# Patient Record
Sex: Male | Born: 1960 | Race: White | Hispanic: No | Marital: Single | Smoking: Never smoker
Health system: Southern US, Community
[De-identification: ages and names within clinical notes are randomized; demographics above are authoritative.]

## PROBLEM LIST (undated history)

## (undated) HISTORY — PX: WISDOM TOOTH EXTRACTION: SHX21

## (undated) HISTORY — PX: TONSILLECTOMY: SUR1361

---

## 2017-11-05 ENCOUNTER — Other Ambulatory Visit: Payer: Self-pay

## 2017-11-05 ENCOUNTER — Encounter (HOSPITAL_COMMUNITY): Payer: Self-pay

## 2017-11-05 ENCOUNTER — Emergency Department (HOSPITAL_COMMUNITY): Payer: BC Managed Care – PPO

## 2017-11-05 ENCOUNTER — Emergency Department (HOSPITAL_COMMUNITY)
Admission: EM | Admit: 2017-11-05 | Discharge: 2017-11-05 | Disposition: A | Discharge status: Home / Self Care | Payer: BC Managed Care – PPO | Attending: Emergency Medicine | Admitting: Emergency Medicine

## 2017-11-05 DIAGNOSIS — Z79899 Other long term (current) drug therapy: Secondary | ICD-10-CM | POA: Insufficient documentation

## 2017-11-05 DIAGNOSIS — R55 Syncope and collapse: Secondary | ICD-10-CM | POA: Diagnosis not present

## 2017-11-05 LAB — CBC WITH DIFFERENTIAL/PLATELET
BASOS PCT: 0 %
Basophils Absolute: 0 10*3/uL (ref 0.0–0.1)
EOS ABS: 0.1 10*3/uL (ref 0.0–0.7)
Eosinophils Relative: 1 %
HEMATOCRIT: 47.4 % (ref 39.0–52.0)
HEMOGLOBIN: 16.7 g/dL (ref 13.0–17.0)
LYMPHS ABS: 1.3 10*3/uL (ref 0.7–4.0)
Lymphocytes Relative: 13 %
MCH: 33.4 pg (ref 26.0–34.0)
MCHC: 35.2 g/dL (ref 30.0–36.0)
MCV: 94.8 fL (ref 78.0–100.0)
Monocytes Absolute: 0.8 10*3/uL (ref 0.1–1.0)
Monocytes Relative: 8 %
NEUTROS ABS: 7.6 10*3/uL (ref 1.7–7.7)
NEUTROS PCT: 78 %
Platelets: 204 10*3/uL (ref 150–400)
RBC: 5 MIL/uL (ref 4.22–5.81)
RDW: 12.6 % (ref 11.5–15.5)
WBC: 9.7 10*3/uL (ref 4.0–10.5)

## 2017-11-05 LAB — URINALYSIS, ROUTINE W REFLEX MICROSCOPIC
Bilirubin Urine: NEGATIVE
GLUCOSE, UA: NEGATIVE mg/dL
Hgb urine dipstick: NEGATIVE
Ketones, ur: 5 mg/dL — AB
LEUKOCYTES UA: NEGATIVE
Nitrite: NEGATIVE
PH: 6 (ref 5.0–8.0)
PROTEIN: NEGATIVE mg/dL
SPECIFIC GRAVITY, URINE: 1.016 (ref 1.005–1.030)

## 2017-11-05 LAB — COMPREHENSIVE METABOLIC PANEL
ALBUMIN: 4 g/dL (ref 3.5–5.0)
ALK PHOS: 71 U/L (ref 38–126)
ALT: 31 U/L (ref 17–63)
AST: 26 U/L (ref 15–41)
Anion gap: 10 (ref 5–15)
BILIRUBIN TOTAL: 0.8 mg/dL (ref 0.3–1.2)
BUN: 19 mg/dL (ref 6–20)
CALCIUM: 8.6 mg/dL — AB (ref 8.9–10.3)
CO2: 20 mmol/L — AB (ref 22–32)
CREATININE: 1.04 mg/dL (ref 0.61–1.24)
Chloride: 110 mmol/L (ref 101–111)
GFR calc Af Amer: >60 mL/min (ref >60)
GFR calc non Af Amer: >60 mL/min (ref >60)
GLUCOSE: 98 mg/dL (ref 65–99)
Potassium: 3.6 mmol/L (ref 3.5–5.1)
SODIUM: 140 mmol/L (ref 135–145)
Total Protein: 7.1 g/dL (ref 6.5–8.1)

## 2017-11-05 LAB — I-STAT TROPONIN, ED: Troponin i, poc: 0 ng/mL (ref 0.00–0.08)

## 2017-11-05 LAB — D-DIMER, QUANTITATIVE: D-Dimer, Quant: 0.38 ug/mL-FEU (ref 0.00–0.50)

## 2017-11-05 MED ORDER — SODIUM CHLORIDE 0.9 % IV BOLUS
1000.0000 mL | Freq: Once | INTRAVENOUS | Status: AC
  Administered 2017-11-05: 1000 mL via INTRAVENOUS

## 2017-11-05 NOTE — ED Notes (Signed)
Patient transported to X-ray 

## 2017-11-05 NOTE — ED Triage Notes (Addendum)
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 He walked to the nurse and layed down. Never experienced full syncope. The nurse said he was pale and diaphoretic. No hx. Per ems. This has never happened to him before.  Zofran given per ems. Patient c/o Light headedness that comes and goes. 200 cc of normal saline. Negative for orthostatics per ems.

## 2017-11-05 NOTE — ED Provider Notes (Signed)
[facility]-EMERGENCY DEPT Provider Note   CSN: [id number] Arrival date & time: 11/05/17  1205     History   Chief Complaint Chief Complaint  Patient presents with  . Near Syncope    HPI Kadin Canipe is a 57 y.o. male.  Patient states that he felt dizzy and on the at school today.  Patient feels better now  The history is provided by the patient. No language interpreter was used.  Near Syncope  This is a new problem. The current episode started less than 1 hour ago. The problem occurs rarely. The problem has been resolved. Pertinent negatives include no chest pain, no abdominal pain and no headaches. Nothing aggravates the symptoms. Nothing relieves the symptoms.    History reviewed. No pertinent past medical history.  There are no active problems to display for this patient.   History reviewed. No pertinent surgical history.      Home Medications    Prior to Admission medications   Medication Sig Start Date End Date Taking? Authorizing Provider  allopurinol (ZYLOPRIM) 300 MG tablet Take 300 mg by mouth daily.   Yes [provider]  Ascorbic Acid (VITAMIN C PO) Take 1 tablet by mouth daily.   Yes [provider]  Multiple Vitamin (MULTIVITAMIN WITH MINERALS) TABS tablet Take 2 tablets by mouth daily.   Yes [provider]    Family History History reviewed. No pertinent family history.  Social History Social History   Tobacco Use  . Smoking status: Not on file  Substance Use Topics  . Alcohol use: Not on file  . Drug use: Not on file     Allergies   Patient has no allergy information on record.   Review of Systems Review of Systems  Constitutional: Negative for appetite change and fatigue.  HENT: Negative for congestion, ear discharge and sinus pressure.   Eyes: Negative for discharge.  Respiratory: Negative for cough.   Cardiovascular: Positive for near-syncope. Negative for chest pain.    Gastrointestinal: Positive for nausea. Negative for abdominal pain and diarrhea.  Genitourinary: Negative for frequency and hematuria.  Musculoskeletal: Negative for back pain.  Skin: Negative for rash.  Neurological: Positive for dizziness. Negative for seizures and headaches.  Psychiatric/Behavioral: Negative for hallucinations.     Physical Exam Updated Vital Signs BP 120/79   Pulse 79   Temp 98.2 F (36.8 C)   Resp 16   Ht  (1.753 m)   Wt 82.6 kg (182 lb)   SpO2 99%   BMI 26.88 kg/m   Physical Exam  Constitutional: He is oriented to person, place, and time. He appears well-developed.  HENT:  Head: Normocephalic.  Eyes: Conjunctivae and EOM are normal. No scleral icterus.  Neck: Neck supple. No thyromegaly present.  Cardiovascular: Normal rate and regular rhythm. Exam reveals no gallop and no friction rub.  No murmur heard. Pulmonary/Chest: No stridor. He has no wheezes. He has no rales. He exhibits no tenderness.  Abdominal: He exhibits no distension. There is no tenderness. There is no rebound.  Musculoskeletal: Normal range of motion. He exhibits no edema.  Lymphadenopathy:    He has no cervical adenopathy.  Neurological: He is oriented to person, place, and time. He exhibits normal muscle tone. Coordination normal.  Skin: No rash noted. No erythema.  Psychiatric: He has a normal mood and affect. His behavior is normal.     ED Treatments / Results  Labs (all labs ordered are listed, but only abnormal results  are displayed) Labs Reviewed  COMPREHENSIVE METABOLIC PANEL - Abnormal; Notable for the following components:      Result Value   CO2 20 (*)    Calcium 8.6 (*)    All other components within normal limits  URINALYSIS, ROUTINE W REFLEX MICROSCOPIC - Abnormal; Notable for the following components:   Ketones, ur 5 (*)    All other components within normal limits  CBC WITH DIFFERENTIAL/PLATELET  D-DIMER, QUANTITATIVE (NOT AT [facility])  I-STAT TROPONIN,  ED    EKG None  Radiology Dg Chest 2 View  Result Date: 11/05/2017 CLINICAL DATA:  Weakness. EXAM: CHEST - 2 VIEW COMPARISON:  None. FINDINGS: The heart size and mediastinal contours are within normal limits. Both lungs are clear. No pneumothorax or pleural effusion is noted. The visualized skeletal structures are unremarkable. IMPRESSION: No active cardiopulmonary disease. Electronically Signed   By: Lupita Raider, M.D.   On: 11/05/2017 13:16   Ct Head Wo Contrast  Result Date: 11/05/2017 CLINICAL DATA:  Dizziness, sweating, nausea EXAM: CT HEAD WITHOUT CONTRAST TECHNIQUE: Contiguous axial images were obtained from the base of the skull through the vertex without intravenous contrast. COMPARISON:  None. FINDINGS: Brain: No evidence of acute infarction, hemorrhage, hydrocephalus, extra-axial collection or mass lesion/mass effect. Vascular: No hyperdense vessel or unexpected calcification. Skull: No osseous abnormality. Sinuses/Orbits: Visualized paranasal sinuses are clear. Visualized mastoid sinuses are clear. Visualized orbits demonstrate no focal abnormality. Other: None IMPRESSION: No acute intracranial pathology. Electronically Signed   By: Elige Ko   On: 11/05/2017 15:16    Procedures Procedures (including critical care time)  Medications Ordered in ED Medications  sodium chloride 0.9 % bolus 1,000 mL (0 mLs Intravenous Stopped 11/05/17 1453)     Initial Impression / Assessment and Plan / ED Course  I have reviewed the triage vital signs and the nursing notes.  Pertinent labs & imaging results that were available during my care of the patient were reviewed by me and considered in my medical decision making (see chart for details).     Patient CBC chemistries CT scan of the head EKG chest x-ray troponin all negative.  Patient with near syncopal episode and some nausea.  Patient is completely back to normal he will follow-up with his PCP  Final Clinical Impressions(s) / ED  Diagnoses   Final diagnoses:  Near syncope    ED Discharge Orders    None       Bethann Berkshire, MD 11/05/17 1537

## 2017-11-05 NOTE — ED Notes (Signed)
Urinal at bedside. Patient aware we need a urine sample.  

## 2017-11-05 NOTE — Discharge Instructions (Addendum)
Stay hydrated and follow-up with your primary care doctor next week

## 2017-11-10 ENCOUNTER — Encounter (HOSPITAL_COMMUNITY): Payer: Self-pay | Admitting: Emergency Medicine

## 2017-11-10 ENCOUNTER — Emergency Department (HOSPITAL_COMMUNITY): Payer: BC Managed Care – PPO

## 2017-11-10 ENCOUNTER — Emergency Department (HOSPITAL_COMMUNITY)
Admission: EM | Admit: 2017-11-10 | Discharge: 2017-11-10 | Disposition: A | Discharge status: Home / Self Care | Payer: BC Managed Care – PPO | Attending: Emergency Medicine | Admitting: Emergency Medicine

## 2017-11-10 DIAGNOSIS — R42 Dizziness and giddiness: Secondary | ICD-10-CM | POA: Diagnosis present

## 2017-11-10 DIAGNOSIS — Z79899 Other long term (current) drug therapy: Secondary | ICD-10-CM | POA: Diagnosis not present

## 2017-11-10 DIAGNOSIS — R0789 Other chest pain: Secondary | ICD-10-CM | POA: Insufficient documentation

## 2017-11-10 LAB — URINALYSIS, ROUTINE W REFLEX MICROSCOPIC
BILIRUBIN URINE: NEGATIVE
Glucose, UA: NEGATIVE mg/dL
HGB URINE DIPSTICK: NEGATIVE
KETONES UR: 5 mg/dL — AB
Leukocytes, UA: NEGATIVE
NITRITE: NEGATIVE
PROTEIN: NEGATIVE mg/dL
Specific Gravity, Urine: 1.026 (ref 1.005–1.030)
pH: 7 (ref 5.0–8.0)

## 2017-11-10 LAB — BASIC METABOLIC PANEL
ANION GAP: 12 (ref 5–15)
BUN: 15 mg/dL (ref 6–20)
CALCIUM: 9.7 mg/dL (ref 8.9–10.3)
CO2: 22 mmol/L (ref 22–32)
CREATININE: 1.06 mg/dL (ref 0.61–1.24)
Chloride: 105 mmol/L (ref 101–111)
GFR calc Af Amer: >60 mL/min (ref >60)
GFR calc non Af Amer: >60 mL/min (ref >60)
GLUCOSE: 101 mg/dL — AB (ref 65–99)
Potassium: 3.9 mmol/L (ref 3.5–5.1)
Sodium: 139 mmol/L (ref 135–145)

## 2017-11-10 LAB — CBC
HCT: 51.9 % (ref 39.0–52.0)
HEMOGLOBIN: 18.6 g/dL — AB (ref 13.0–17.0)
MCH: 33.9 pg (ref 26.0–34.0)
MCHC: 35.8 g/dL (ref 30.0–36.0)
MCV: 94.5 fL (ref 78.0–100.0)
PLATELETS: 266 10*3/uL (ref 150–400)
RBC: 5.49 MIL/uL (ref 4.22–5.81)
RDW: 12.1 % (ref 11.5–15.5)
WBC: 11.4 10*3/uL — ABNORMAL HIGH (ref 4.0–10.5)

## 2017-11-10 LAB — CBG MONITORING, ED: GLUCOSE-CAPILLARY: 99 mg/dL (ref 65–99)

## 2017-11-10 LAB — TROPONIN I: Troponin I: <0.03 ng/mL (ref <0.03)

## 2017-11-10 LAB — D-DIMER, QUANTITATIVE (NOT AT ARMC): D DIMER QUANT: 18.02 ug{FEU}/mL — AB (ref 0.00–0.50)

## 2017-11-10 MED ORDER — MECLIZINE HCL 25 MG PO TABS
25.0000 mg | ORAL_TABLET | Freq: Once | ORAL | Status: AC
  Administered 2017-11-10: 25 mg via ORAL
  Filled 2017-11-10: qty 1

## 2017-11-10 MED ORDER — IOPAMIDOL (ISOVUE-370) INJECTION 76%
INTRAVENOUS | Status: AC
  Filled 2017-11-10: qty 100

## 2017-11-10 MED ORDER — SODIUM CHLORIDE 0.9 % IV BOLUS
1000.0000 mL | Freq: Once | INTRAVENOUS | Status: AC
  Administered 2017-11-10: 1000 mL via INTRAVENOUS

## 2017-11-10 MED ORDER — IOPAMIDOL (ISOVUE-370) INJECTION 76%
100.0000 mL | Freq: Once | INTRAVENOUS | Status: AC | PRN
  Administered 2017-11-10: 100 mL via INTRAVENOUS

## 2017-11-10 MED ORDER — LORAZEPAM 2 MG/ML IJ SOLN
1.0000 mg | Freq: Once | INTRAMUSCULAR | Status: AC
  Administered 2017-11-10: 1 mg via INTRAVENOUS
  Filled 2017-11-10: qty 1

## 2017-11-10 MED ORDER — MECLIZINE HCL 25 MG PO TABS: 25.0000 mg | ORAL_TABLET | Freq: Three times a day (TID) | ORAL | 0 refills | Status: AC | PRN

## 2017-11-10 NOTE — ED Notes (Addendum)
Spoke to MRI. MRI will most likely not be able to get to patient until around 2000.

## 2017-11-10 NOTE — ED Notes (Addendum)
Urinal at bedside. Patient aware we need urine sample.  

## 2017-11-10 NOTE — Discharge Instructions (Addendum)
If you develop worsening dizziness, inability to walk, vomiting, weakness or numbness in your arms or legs, trouble speaking, seeing, chest pain, shortness of breath or any other new/concerning symptoms then return to the ER for evaluation.  Follow-up closely with your doctor this week.

## 2017-11-10 NOTE — ED Provider Notes (Signed)
[facility]-EMERGENCY DEPT Provider Note   CSN: [id number] Arrival date & time: 11/10/17  1453     History   Chief Complaint Chief Complaint  Patient presents with  . Dizziness    HPI Jose Leon is a 57 y.o. male.  HPI  57 year old male with a prior history of gout but no other significant past medical history presents with dizziness.  He was seen here last week for the same.  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  He states if he turns his head quickly it seemed acutely worsen.  The symptoms are not as significant now that is at rest but it is still present.  There is no headache.  He has noticed some vague chest tightness that seems only present whenever he takes a deep breath.  However no shortness of breath.  He states he did not feel great over the weekend after his previous ED visit but did not have the recurrent dizziness.  No weakness or numbness in his extremities.  No smoking, alcohol use, or illicit drug use.  History reviewed. No pertinent past medical history.  There are no active problems to display for this patient.   History reviewed. No pertinent surgical history.      Home Medications    Prior to Admission medications   Medication Sig Start Date End Date Taking? Authorizing Provider  allopurinol (ZYLOPRIM) 300 MG tablet Take 300 mg by mouth daily.   Yes [provider]  Ascorbic Acid (VITAMIN C PO) Take 1 tablet by mouth daily.   Yes [provider]  Multiple Vitamin (MULTIVITAMIN WITH MINERALS) TABS tablet Take 2 tablets by mouth daily.   Yes [provider]  meclizine (ANTIVERT) 25 MG tablet Take 1 tablet (25 mg total) by mouth 3 (three) times daily as needed for dizziness. 11/10/17   Pricilla LovelessGoldston, Keelynn Furgerson, MD    Family History No family history on file.  Social History Social History   Tobacco Use  . Smoking status: Never  Smoker  . Smokeless tobacco: Never Used  Substance Use Topics  . Alcohol use: Not Currently  . Drug use: Not on file     Allergies   Patient has no known allergies.   Review of Systems Review of Systems  Constitutional: Negative for fever.  Respiratory: Positive for chest tightness. Negative for shortness of breath.   Cardiovascular: Negative for chest pain and leg swelling.  Neurological: Positive for dizziness. Negative for weakness, numbness and headaches.  All other systems reviewed and are negative.    Physical Exam Updated Vital Signs BP 121/84   Pulse 94   Temp 98.1 F (36.7 C) (Oral)   Resp 16   SpO2 97%   Physical Exam  Constitutional: He is oriented to person, place, and time. He appears well-developed and well-nourished. No distress.  HENT:  Head: Normocephalic and atraumatic.  Right Ear: External ear normal.  Left Ear: External ear normal.  Nose: Nose normal.  Eyes: Pupils are equal, round, and reactive to light. EOM are normal. Right eye exhibits no discharge. Left eye exhibits no discharge.  Neck: Neck supple.  Cardiovascular: Regular rhythm and normal heart sounds. Tachycardia present.  Pulmonary/Chest: Effort normal and breath sounds normal.  Abdominal: Soft. He exhibits no distension. There is no tenderness.  Musculoskeletal: He exhibits no edema.  Neurological: He is alert and oriented to person, place, and  time.  CN 3-12 grossly intact. 5/5 strength in all 4 extremities. Grossly normal sensation. Normal finger to nose.   Skin: Skin is warm and dry. He is not diaphoretic.  Nursing note and vitals reviewed.    ED Treatments / Results  Labs (all labs ordered are listed, but only abnormal results are displayed) Labs Reviewed  BASIC METABOLIC PANEL - Abnormal; Notable for the following components:      Result Value   Glucose, Bld 101 (*)    All other components within normal limits  CBC - Abnormal; Notable for the following components:   WBC  11.4 (*)    Hemoglobin 18.6 (*)    All other components within normal limits  URINALYSIS, ROUTINE W REFLEX MICROSCOPIC - Abnormal; Notable for the following components:   Color, Urine STRAW (*)    Ketones, ur 5 (*)    All other components within normal limits  D-DIMER, QUANTITATIVE (NOT AT [facility]) - Abnormal; Notable for the following components:   D-Dimer, Quant 18.02 (*)    All other components within normal limits  TROPONIN I  TROPONIN I  CBG MONITORING, ED    EKG EKG Interpretation  Date/Time:  Monday November 10 2017 17:10:13 EDT Ventricular Rate:  94 PR Interval:    QRS Duration: 94 QT Interval:  338 QTC Calculation: 423 R Axis:   87 Text Interpretation:  Sinus rhythm Borderline T abnormalities, inferior leads no significant change since Nov 05 2017 Confirmed by Pricilla Loveless [phone]) on 11/10/2017 5:28:07 PM  EKG Interpretation  Date/Time:  Monday November 10 2017 20:46:09 EDT Ventricular Rate:  95 PR Interval:    QRS Duration: 90 QT Interval:  336 QTC Calculation: 423 R Axis:   95 Text Interpretation:  Sinus rhythm Borderline right axis deviation Borderline T abnormalities, inferior leads no significant change since earlier in the day Confirmed by Pricilla Loveless [phone]) on 11/10/2017 10:57:28 PM   Radiology Ct Angio Chest Pe W/cm &/or Wo Cm  Result Date: 11/10/2017 CLINICAL DATA:  Dizziness, chest tightness with deep breathing EXAM: CT ANGIOGRAPHY CHEST WITH CONTRAST TECHNIQUE: Multidetector CT imaging of the chest was performed using the standard protocol during bolus administration of intravenous contrast. Multiplanar CT image reconstructions and MIPs were obtained to evaluate the vascular anatomy. CONTRAST:  ISOVUE-370 IOPAMIDOL (ISOVUE-370) INJECTION 76% COMPARISON:  Chest x-ray 11/05/2017 FINDINGS: Cardiovascular: No filling defects in the pulmonary arteries to suggest pulmonary emboli. Insert Heart Mediastinum/Nodes: No mediastinal, hilar, or axillary adenopathy.  Lungs/Pleura: Lungs are clear. No focal airspace opacities or suspicious nodules. No effusions. Upper Abdomen: Imaging into the upper abdomen shows no acute findings. Musculoskeletal: Chest wall soft tissues are unremarkable. No acute bony abnormality. Review of the MIP images confirms the above findings. IMPRESSION: No evidence of pulmonary embolus. No acute cardiopulmonary disease. Electronically Signed   By: Charlett Nose M.D.   On: 11/10/2017 18:46   Mr Brain Wo Contrast (neuro Protocol)  Result Date: 11/10/2017 CLINICAL DATA:  Dizziness for 1 week EXAM: MRI HEAD WITHOUT CONTRAST TECHNIQUE: Multiplanar, multiecho pulse sequences of the brain and surrounding structures were obtained without intravenous contrast. COMPARISON:  Head CT 11/05/2017 FINDINGS: BRAIN: The midline structures are normal. There is no acute infarct or acute hemorrhage. There is no mass lesion or other mass effect. There is no hydrocephalus, dural abnormality or extra-axial collection. The white matter signal is normal for the patient's age. Prominent perivascular spaces of both lentiform nuclei. No age-advanced or lobar predominant atrophy. No chronic microhemorrhage or superficial siderosis.  VASCULAR: Major intracranial arterial and venous sinus flow voids are preserved. SKULL AND UPPER CERVICAL SPINE: The visualized skull base, calvarium, upper cervical spine and extracranial soft tissues are normal. SINUSES/ORBITS: No fluid levels or advanced mucosal thickening. No mastoid or middle ear effusion. The orbits are normal. IMPRESSION: Normal brain MRI. Electronically Signed   By: Deatra Robinson M.D.   On: 11/10/2017 20:58    Procedures Procedures (including critical care time)  Medications Ordered in ED Medications  sodium chloride 0.9 % bolus 1,000 mL (0 mLs Intravenous Stopped 11/10/17 1836)  meclizine (ANTIVERT) tablet 25 mg (25 mg Oral Given 11/10/17 1721)  LORazepam (ATIVAN) injection 1 mg (1 mg Intravenous Given 11/10/17 2002)    iopamidol (ISOVUE-370) 76 % injection 100 mL (100 mLs Intravenous Contrast Given 11/10/17 1817)     Initial Impression / Assessment and Plan / ED Course  I have reviewed the triage vital signs and the nursing notes.  Pertinent labs & imaging results that were available during my care of the patient were reviewed by me and considered in my medical decision making (see chart for details).     Patient has some vague chest pain that is quite atypical.  He is low risk otherwise for ACS.  Given the pleuritic symptoms and the dizziness, d-dimer obtained and is elevated and thus CT obtained but is negative for PE.  He does feel somewhat better with Antivert.  He was given Ativan for an MRI as I am concerned about a possible vertigo.  Given his persistent symptoms, there was a need to rule out stroke.  His MRI is unremarkable.  His dizziness is better and I have seen him ambulate without difficulty.  Unclear where the dizziness is coming from but given the positional changes he has described this could be vertiginous and thus I will discharge him with Antivert.  Follow-up closely with PCP.  We discussed strict return precautions.  Final Clinical Impressions(s) / ED Diagnoses   Final diagnoses:  Dizziness  Atypical chest pain    ED Discharge Orders        Ordered    meclizine (ANTIVERT) 25 MG tablet  3 times daily PRN     11/10/17 2214       Pricilla Loveless, MD 11/10/17 2257

## 2017-11-10 NOTE — ED Triage Notes (Signed)
Pt reports that today when he was back at work started feeling dizziness again. Reports he took it easy over the weekend like he was instructed.  Reports when he takes deep breaths he has chest tightness. Denies n/v.

## 2017-11-13 ENCOUNTER — Other Ambulatory Visit: Payer: Self-pay | Admitting: Family Medicine

## 2017-11-13 ENCOUNTER — Ambulatory Visit
Admission: RE | Admit: 2017-11-13 | Discharge: 2017-11-13 | Disposition: A | Discharge status: Home / Self Care | Payer: BC Managed Care – PPO | Source: Ambulatory Visit | Attending: Family Medicine | Admitting: Family Medicine

## 2017-11-13 DIAGNOSIS — M79605 Pain in left leg: Principal | ICD-10-CM

## 2017-11-13 DIAGNOSIS — M79604 Pain in right leg: Secondary | ICD-10-CM

## 2017-11-17 ENCOUNTER — Encounter: Payer: Self-pay | Admitting: Cardiology

## 2017-11-17 ENCOUNTER — Ambulatory Visit: Payer: BC Managed Care – PPO | Admitting: Cardiology

## 2017-11-17 VITALS — BP 120/68 | HR 89 | Ht 69.0 in | Wt 178.0 lb

## 2017-11-17 DIAGNOSIS — R0789 Other chest pain: Secondary | ICD-10-CM | POA: Diagnosis not present

## 2017-11-17 DIAGNOSIS — R002 Palpitations: Secondary | ICD-10-CM | POA: Insufficient documentation

## 2017-11-17 MED ORDER — NITROGLYCERIN 0.4 MG SL SUBL: 0.4000 mg | SUBLINGUAL_TABLET | SUBLINGUAL | 11 refills | Status: AC | PRN

## 2017-11-17 NOTE — Patient Instructions (Signed)
Medication Instructions:  Your physician has recommended you make the following change in your medication:   START: Nitroglycerin 0.4mg  sublingual (under the tongue) use as needed for chest pains  When having chest pain, stop what you are doing and sit down. Take 1 nitro, wait 5 minutes. Still having chest pain, take 1 nitro, wait 5 minutes. Still having chest pain, take 1 nitro, dial 911. Total of 3 nitro in 15 minutes.    Labwork: NONE   Testing/Procedures: Your physician has requested that you have a stress echocardiogram. For further information please visit [email or web address]. Please follow instruction sheet as given.  Your physician has recommended that you wear an event monitor. Event monitors are medical devices that record the heart's electrical activity. Doctors most often us these monitors to diagnose arrhythmias. Arrhythmias are problems with the speed or rhythm of the heartbeat. The monitor is a small, portable device. You can wear one while you do your normal daily activities. This is usually used to diagnose what is causing palpitations/syncope (passing out).    Follow-Up: Your physician recommends that you schedule a follow-up appointment in: 2 months   Any Other Special Instructions Will Be Listed Below (If Applicable).     If you need a refill on your cardiac medications before your next appointment, please call your pharmacy.     Nitroglycerin sublingual tablets What is this medicine? NITROGLYCERIN (nye troe GLI ser in) is a type of vasodilator. It relaxes blood vessels, increasing the blood and oxygen supply to your heart. This medicine is used to relieve chest pain caused by angina. It is also used to prevent chest pain before activities like climbing stairs, going outdoors in cold weather, or sexual activity. This medicine may be used for other purposes; ask your health care provider or pharmacist if you have questions. COMMON BRAND NAME(S): Nitroquick,  Nitrostat, Nitrotab What should I tell my health care provider before I take this medicine? They need to know if you have any of these conditions: -anemia -head injury, recent stroke, or bleeding in the brain -liver disease -previous heart attack -an unusual or allergic reaction to nitroglycerin, other medicines, foods, dyes, or preservatives -pregnant or trying to get pregnant -breast-feeding How should I use this medicine? Take this medicine by mouth as needed. At the first sign of an angina attack (chest pain or tightness) place one tablet under your tongue. You can also take this medicine 5 to 10 minutes before an event likely to produce chest pain. Follow the directions on the prescription label. Let the tablet dissolve under the tongue. Do not swallow whole. Replace the dose if you accidentally swallow it. It will help if your mouth is not dry. Saliva around the tablet will help it to dissolve more quickly. Do not eat or drink, smoke or chew tobacco while a tablet is dissolving. If you are not better within 5 minutes after taking ONE dose of nitroglycerin, call 9-1-1 immediately to seek emergency medical care. Do not take more than 3 nitroglycerin tablets over 15 minutes. If you take this medicine often to relieve symptoms of angina, your doctor or health care professional may provide you with different instructions to manage your symptoms. If symptoms do not go away after following these instructions, it is important to call 9-1-1 immediately. Do not take more than 3 nitroglycerin tablets over 15 minutes. Talk to your pediatrician regarding the use of this medicine in children. Special care may be needed. Overdosage: If you think you have taken too  much of this medicine contact a poison control center or emergency room at once. NOTE: This medicine is only for you. Do not share this medicine with others. What if I miss a dose? This does not apply. This medicine is only used as needed. What  may interact with this medicine? Do not take this medicine with any of the following medications: -certain migraine medicines like ergotamine and dihydroergotamine (DHE) -medicines used to treat erectile dysfunction like sildenafil, tadalafil, and vardenafil -riociguat This medicine may also interact with the following medications: -alteplase -aspirin -heparin -medicines for high blood pressure -medicines for mental depression -other medicines used to treat angina -phenothiazines like chlorpromazine, mesoridazine, prochlorperazine, thioridazine This list may not describe all possible interactions. Give your health care provider a list of all the medicines, herbs, non-prescription drugs, or dietary supplements you use. Also tell them if you smoke, drink alcohol, or use illegal drugs. Some items may interact with your medicine. What should I watch for while using this medicine? Tell your doctor or health care professional if you feel your medicine is no longer working. Keep this medicine with you at all times. Sit or lie down when you take your medicine to prevent falling if you feel dizzy or faint after using it. Try to remain calm. This will help you to feel better faster. If you feel dizzy, take several deep breaths and lie down with your feet propped up, or bend forward with your head resting between your knees. You may get drowsy or dizzy. Do not drive, use machinery, or do anything that needs mental alertness until you know how this drug affects you. Do not stand or sit up quickly, especially if you are an older patient. This reduces the risk of dizzy or fainting spells. Alcohol can make you more drowsy and dizzy. Avoid alcoholic drinks. Do not treat yourself for coughs, colds, or pain while you are taking this medicine without asking your doctor or health care professional for advice. Some ingredients may increase your blood pressure. What side effects may I notice from receiving this  medicine? Side effects that you should report to your doctor or health care professional as soon as possible: -blurred vision -dry mouth -skin rash -sweating -the feeling of extreme pressure in the head -unusually weak or tired Side effects that usually do not require medical attention (report to your doctor or health care professional if they continue or are bothersome): -flushing of the face or neck -headache -irregular heartbeat, palpitations -nausea, vomiting This list may not describe all possible side effects. Call your doctor for medical advice about side effects. You may report side effects to FDA at 1-800-FDA-1088. Where should I keep my medicine? Keep out of the reach of children. Store at room temperature between 20 and 25 degrees C (68 and 77 degrees F). Store in Retail buyer. Protect from light and moisture. Keep tightly closed. Throw away any unused medicine after the expiration date. NOTE: This sheet is a summary. It may not cover all possible information. If you have questions about this medicine, talk to your doctor, pharmacist, or health care provider.  2018 Elsevier/Gold Standard (2013-03-25 17:57:36)

## 2017-11-17 NOTE — Progress Notes (Signed)
Cardiology Office Note:    Date:  11/17/2017   ID:  Jose CiscoMichael Carnevale, DOB 08/19/1960, MRN [id number]  PCP:  Farris HasMorrow, Aaron, MD  Cardiologist:  Garwin Brothersajan R Justun Anaya, MD   Referring MD: Farris HasMorrow, Aaron, MD    ASSESSMENT:    1. Chest tightness   2. Palpitations    PLAN:    In order of problems listed above:  1. Primary prevention stressed with the patient.  Importance of compliance with diet and medications stressed and he vocalized understanding.  His blood pressure is stable and he will get me a copy of lipid work done at primary care physician's office. 2. Sublingual nitroglycerin prescription was sent, its protocol and 911 protocol explained and the patient vocalized understanding questions were answered to the patient's satisfaction 3. Patient will undergo stress echo testing to evaluate him for objective assessment of his chest tightness.  He has low risk for coronary artery disease and also his story is atypical. 4. He will undergo event monitoring for his palpitations to see if there is any arrhythmia that may be a cause for the symptoms. 5. Patient will be seen in follow-up appointment in 2 months or earlier if the patient has any concerns    Medication Adjustments/Labs and Tests Ordered: Current medicines are reviewed at length with the patient today.  Concerns regarding medicines are outlined above.  No orders of the defined types were placed in this encounter.  No orders of the defined types were placed in this encounter.    History of Present Illness:    Jose Leon is a 57 y.o. male who is being seen today for the evaluation of chest tightness and palpitations at the request of Farris HasMorrow, Aaron, MD.  Patient is a pleasant 57 year old male.  He has no significant past medical history.  He has no history of hypertension diabetes mellitus and is not aware of his cholesterol status.  He complains to me that he had chest tightness on 2 occasions and went to the emergency room.  No  orthopnea or PND.  The first time he was profusely sweating.  He was evaluated in the emergency room on both occasions.  He also has had work-up for elevated d-dimer including DVT study of his lower extremities and CT scan to rule out pulmonary embolism.  He tells me that he is an active gentleman.  He is going to the gym even the day before the first time he had these episodes.  He is here for this evaluation.  After the second hospital discharge she has had no such symptoms.  No orthopnea PND or syncope.  At the time of my evaluation, the patient is alert awake oriented and in no distress.  History reviewed. No pertinent past medical history.  Past Surgical History:  Procedure Laterality Date  . TONSILLECTOMY    . WISDOM TOOTH EXTRACTION      Current Medications: Current Meds  Medication Sig  . allopurinol (ZYLOPRIM) 300 MG tablet Take 300 mg by mouth daily.  . Ascorbic Acid (VITAMIN C PO) Take 1 tablet by mouth daily.  . meclizine (ANTIVERT) 25 MG tablet Take 1 tablet (25 mg total) by mouth 3 (three) times daily as needed for dizziness.  . Multiple Vitamin (MULTIVITAMIN WITH MINERALS) TABS tablet Take 2 tablets by mouth daily.     Allergies:   Patient has no known allergies.   Social History   Socioeconomic History  . Marital status: Single    Spouse name: Not on file  .  Number of children: Not on file  . Years of education: Not on file  . Highest education level: Not on file  Occupational History  . Not on file  Social Needs  . Financial resource strain: Not on file  . Food insecurity:    Worry: Not on file    Inability: Not on file  . Transportation needs:    Medical: Not on file    Non-medical: Not on file  Tobacco Use  . Smoking status: Never Smoker  . Smokeless tobacco: Never Used  Substance and Sexual Activity  . Alcohol use: Not Currently  . Drug use: Not on file  . Sexual activity: Not on file  Lifestyle  . Physical activity:    Days per week: Not on file     Minutes per session: Not on file  . Stress: Not on file  Relationships  . Social connections:    Talks on phone: Not on file    Gets together: Not on file    Attends religious service: Not on file    Active member of club or organization: Not on file    Attends meetings of clubs or organizations: Not on file    Relationship status: Not on file  Other Topics Concern  . Not on file  Social History Narrative  . Not on file     Family History: The patient's family history includes Cancer in his brother; Dementia in his maternal grandmother and mother.  ROS:   Please see the history of present illness.    All other systems reviewed and are negative.  EKGs/Labs/Other Studies Reviewed:    The following studies were reviewed today: I discussed my findings with the patient at length and reviewed EKG done in the emergency room.   Recent Labs: 11/05/2017: ALT 31 11/10/2017: BUN 15; Creatinine, Ser 1.06; Hemoglobin 18.6; Platelets 266; Potassium 3.9; Sodium 139  Recent Lipid Panel No results found for: CHOL, TRIG, HDL, CHOLHDL, VLDL, LDLCALC, LDLDIRECT  Physical Exam:    VS:  BP 120/68 (BP Location: Left Arm, Patient Position: Sitting, Cuff Size: Normal)   Pulse 89   Ht 5\' 9"  (1.753 m)   Wt 178 lb (80.7 kg)   SpO2 98%   BMI 26.29 kg/m     Wt Readings from Last 3 Encounters:  11/17/17 178 lb (80.7 kg)  11/05/17 182 lb (82.6 kg)     GEN: Patient is in no acute distress HEENT: Normal NECK: No JVD; No carotid bruits LYMPHATICS: No lymphadenopathy CARDIAC: S1 S2 regular, 2/6 systolic murmur at the apex. RESPIRATORY:  Clear to auscultation without rales, wheezing or rhonchi  ABDOMEN: Soft, non-tender, non-distended MUSCULOSKELETAL:  No edema; No deformity  SKIN: Warm and dry NEUROLOGIC:  Alert and oriented x 3 PSYCHIATRIC:  Normal affect    Signed, Garwin Brothers, MD  11/17/2017 4:10 PM    [facility]

## 2017-12-09 ENCOUNTER — Ambulatory Visit (HOSPITAL_BASED_OUTPATIENT_CLINIC_OR_DEPARTMENT_OTHER)
Admission: RE | Admit: 2017-12-09 | Discharge: 2017-12-09 | Disposition: A | Discharge status: Home / Self Care | Payer: BC Managed Care – PPO | Source: Ambulatory Visit | Attending: Cardiology | Admitting: Cardiology

## 2017-12-09 DIAGNOSIS — R002 Palpitations: Secondary | ICD-10-CM | POA: Diagnosis not present

## 2017-12-09 DIAGNOSIS — R0789 Other chest pain: Secondary | ICD-10-CM | POA: Diagnosis not present

## 2017-12-09 NOTE — Progress Notes (Signed)
Echocardiogram Echocardiogram Stress Test with limited exam has been performed.  Jose Leon, Jose Leon M 12/09/2017, 3:07 PM

## 2017-12-25 ENCOUNTER — Encounter: Payer: Self-pay | Admitting: Hematology

## 2017-12-25 ENCOUNTER — Telehealth: Payer: Self-pay | Admitting: Hematology

## 2017-12-25 NOTE — Telephone Encounter (Signed)
New patient referral received from Dr. Kateri PlummerMorrow at [facility]. Pt has been scheduled to see Dr. Candise CheKale on 8/6 at 10am. Pt aware to arrive 30 minutes early. Letter mailed.

## 2018-01-06 ENCOUNTER — Inpatient Hospital Stay: Payer: BC Managed Care – PPO | Attending: Hematology | Admitting: Hematology and Oncology

## 2018-01-06 ENCOUNTER — Inpatient Hospital Stay: Payer: BC Managed Care – PPO

## 2018-01-06 VITALS — BP 134/96 | HR 91 | Temp 98.4°F | Resp 20 | Ht 69.0 in | Wt 174.1 lb

## 2018-01-06 DIAGNOSIS — D751 Secondary polycythemia: Secondary | ICD-10-CM | POA: Diagnosis not present

## 2018-01-06 LAB — CBC WITH DIFFERENTIAL (CANCER CENTER ONLY)
BASOS PCT: 1 %
Basophils Absolute: 0 10*3/uL (ref 0.0–0.1)
EOS ABS: 0.1 10*3/uL (ref 0.0–0.5)
Eosinophils Relative: 1 %
HCT: 51.5 % — ABNORMAL HIGH (ref 38.4–49.9)
Hemoglobin: 18.3 g/dL — ABNORMAL HIGH (ref 13.0–17.1)
Lymphocytes Relative: 28 %
Lymphs Abs: 2.3 10*3/uL (ref 0.9–3.3)
MCH: 33.6 pg — ABNORMAL HIGH (ref 27.2–33.4)
MCHC: 35.5 g/dL (ref 32.0–36.0)
MCV: 94.7 fL (ref 79.3–98.0)
MONO ABS: 0.7 10*3/uL (ref 0.1–0.9)
MONOS PCT: 8 %
Neutro Abs: 5.2 10*3/uL (ref 1.5–6.5)
Neutrophils Relative %: 62 %
Platelet Count: 248 10*3/uL (ref 140–400)
RBC: 5.44 MIL/uL (ref 4.20–5.82)
RDW: 12.5 % (ref 11.0–14.6)
WBC Count: 8.3 10*3/uL (ref 4.0–10.3)

## 2018-01-06 NOTE — Assessment & Plan Note (Signed)
11/10/2017: Hemoglobin 18.6 hematocrit 51.9 Recent problems with chest pain resulting from panic attacks Has seen cardiology  I discussed with the patient extensively the differential diagnosis of polycythemia 1. Primary polycythemia due to clonal stem cell abnormality 2. Secondary polycythemia due to cause that include hypoxia, heart or lung problems, altitude, athletics, erythropoietin producing lesion/tumors etc  Recommendation: 1. JAK-2 mutation testing to evaluate polycythemia vera 2. Erythropoietin level 3. Drink 8 glasses of water per day  Indications for phlebotomy 1. Primary polycythemia with hematocrit over 55 2. Secondary polycythemia with severe symptoms which include strokelike symptoms, severe recurrent headaches, severe fatigue.  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  Patient has been using energy drinks like C4 and G4: These products contained substances that are adrenal stimulants.  It is possible that his elevation of hemoglobin could be related to these energy drinks.  I instructed him not to drink those anymore.  Today we will obtain erythropoietin level and Jak 2 mutation testing along with CBC with differential.  I will call him with the results of these tests.  If these are normal then no further work-up is necessary.

## 2018-01-06 NOTE — Progress Notes (Signed)
[facility] CONSULT NOTE  Patient Care Team: Farris HasMorrow, Aaron, MD as PCP - General (Family Medicine)  CHIEF COMPLAINTS/PURPOSE OF CONSULTATION:  Newly diagnosed elevated hemoglobin  HISTORY OF PRESENTING ILLNESS:  Jose Leon 57 y.o. male is here because of recent diagnosis of elevated hemoglobin.  Recently patient has been having chest pains for which she has seen cardiology.  Extensive work-up did not reveal a cardiac cause but it was felt that he had panic attacks.  He was started on Lexapro which appears to be helping him.  In June he had blood work that revealed hemoglobin of 18.6.  Because of this he was sent to us for evaluation.  At that time he has been staying fairly active with going to the gym and drinking allergy supplements like C4 and G4.  His primary care physician instructed him to discontinue these drinks.  It appears that after the discontinuation of these drinks his hemoglobin had come down to 17.7.   I reviewed her records extensively and collaborated the history with the patient.  MEDICAL HISTORY:  Panic attacks SURGICAL HISTORY: Past Surgical History:  Procedure Laterality Date  . TONSILLECTOMY    . WISDOM TOOTH EXTRACTION     Social history: Denies any tobacco alcohol or recreational drug use. 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  FAMILY HISTORY: Family History  Problem Relation Age of Onset  . Dementia Mother   . Cancer Brother   . Dementia Maternal Grandmother     ALLERGIES:  has No Known Allergies.  MEDICATIONS:  Current Outpatient Medications  Medication Sig Dispense Refill  . allopurinol (ZYLOPRIM) 300 MG tablet Take 300 mg by mouth daily.    . Ascorbic Acid (VITAMIN C PO) Take 1 tablet by mouth daily.    . meclizine (ANTIVERT) 25 MG tablet Take 1 tablet (25 mg total) by mouth 3 (three) times daily as needed for dizziness. 30 tablet 0  . Multiple Vitamin (MULTIVITAMIN WITH MINERALS) TABS  tablet Take 2 tablets by mouth daily.    . nitroGLYCERIN (NITROSTAT) 0.4 MG SL tablet Place 1 tablet (0.4 mg total) under the tongue every 5 (five) minutes as needed for chest pain. 25 tablet 11   No current facility-administered medications for this visit.     REVIEW OF SYSTEMS:   Constitutional: Denies fevers, chills or abnormal night sweats Eyes: Denies blurriness of vision, double vision or watery eyes Ears, nose, mouth, throat, and face: Denies mucositis or sore throat Respiratory: Denies cough, dyspnea or wheezes Cardiovascular: Denies palpitation, chest discomfort or lower extremity swelling Gastrointestinal:  Denies nausea, heartburn or change in bowel habits Skin: Denies abnormal skin rashes Lymphatics: Denies new lymphadenopathy or easy bruising Neurological:Denies numbness, tingling or new weaknesses Behavioral/Psych: Mood is stable, no new changes   All other systems were reviewed with the patient and are negative.  PHYSICAL EXAMINATION: ECOG PERFORMANCE STATUS: 0 - Asymptomatic  Vitals:   01/06/18 1256  BP: (!) 134/96  Pulse: 91  Resp: 20  Temp: 98.4 F (36.9 C)  SpO2: 99%   Filed Weights   01/06/18 1256  Weight: 174 lb 1.6 oz (79 kg)    GENERAL:alert, no distress and comfortable SKIN: skin color, texture, turgor are normal, no rashes or significant lesions EYES: normal, conjunctiva are pink and non-injected, sclera clear OROPHARYNX:no exudate, no erythema and lips, buccal mucosa, and tongue normal  NECK: supple, thyroid normal size, non-tender, without nodularity LYMPH:  no palpable lymphadenopathy in  the cervical, axillary or inguinal LUNGS: clear to auscultation and percussion with normal breathing effort HEART: regular rate & rhythm and no murmurs and no lower extremity edema ABDOMEN:abdomen soft, non-tender and normal bowel sounds Musculoskeletal:no cyanosis of digits and no clubbing  PSYCH: alert & oriented x 3 with fluent speech NEURO: no focal  motor/sensory deficits  LABORATORY DATA:  I have reviewed the data as listed Lab Results  Component Value Date   WBC 11.4 (H) 11/10/2017   HGB 18.6 (H) 11/10/2017   HCT 51.9 11/10/2017   MCV 94.5 11/10/2017   PLT 266 11/10/2017   Lab Results  Component Value Date   NA 139 11/10/2017   K 3.9 11/10/2017   CL 105 11/10/2017   CO2 22 11/10/2017    RADIOGRAPHIC STUDIES: I have personally reviewed the radiological reports and agreed with the findings in the report.  ASSESSMENT AND PLAN:  Erythrocytosis 11/10/2017: Hemoglobin 18.6 hematocrit 51.9 Recent problems with chest pain resulting from panic attacks Has seen cardiology  I discussed with the patient extensively the differential diagnosis of polycythemia 1. Primary polycythemia due to clonal stem cell abnormality 2. Secondary polycythemia due to cause that include hypoxia, heart or lung problems, altitude, athletics, erythropoietin producing lesion/tumors etc  Recommendation: 1. JAK-2 mutation testing to evaluate polycythemia vera 2. Erythropoietin level 3. Drink 8 glasses of water per day  Indications for phlebotomy 1. Primary polycythemia with hematocrit over 55 2. Secondary polycythemia with severe symptoms which include strokelike symptoms, severe recurrent headaches, severe fatigue.  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  Patient has been using energy drinks like C4 and G4: These products contained substances that are adrenal stimulants.  It is possible that his elevation of hemoglobin could be related to these energy drinks.  I instructed him not to drink those anymore.  Today we will obtain erythropoietin level and Jak 2 mutation testing along with CBC with differential.  I will call him with the results of these tests.  If these are normal then no further work-up is necessary.       All questions were answered.  The patient knows to call the clinic with any problems, questions or concerns.    Tamsen Meek, MD 01/06/18

## 2018-01-07 LAB — ERYTHROPOIETIN: ERYTHROPOIETIN: 5.7 m[IU]/mL (ref 2.6–18.5)

## 2018-01-12 ENCOUNTER — Telehealth: Payer: Self-pay

## 2018-01-12 NOTE — Telephone Encounter (Signed)
Returned patient's call regarding lab results.  Informed patient of lab results that were completed.  JAK2 lab still pending.  Patient aware that we will call once that lab is final.  No further needs at this time.

## 2018-01-13 ENCOUNTER — Encounter: Payer: BC Managed Care – PPO | Admitting: Hematology

## 2018-01-16 ENCOUNTER — Telehealth: Payer: Self-pay

## 2018-01-16 NOTE — Telephone Encounter (Signed)
Returned patient's call regarding follow up with labs.  Informed patient that JAK2 was still pending and can take up to 10-14 business days.  Patient voiced understanding and appreciation.

## 2018-01-20 ENCOUNTER — Telehealth: Payer: Self-pay

## 2018-01-20 NOTE — Telephone Encounter (Signed)
Called and spoke with pt to let him know that his Jak mutation was normal. No further follow up necessary per Dr.Gudena's note. Encourage pt to drink plenty of fluids and to avoid caffein energy drinks per MD notes. Pt verbalized understanding and will follow recommendations. No further needs at this time.

## 2018-01-21 LAB — JAK2 (INCLUDING V617F AND EXON 12), MPL,& CALR W/RFL MPN PANEL (NGS)

## 2019-06-25 IMAGING — CR DG CHEST 2V
2 series · 2 of 2 positions shown · non-contrast
Comparison: None.

CLINICAL DATA: Weakness.

EXAM:
CHEST - 2 VIEW

[w chest pa]
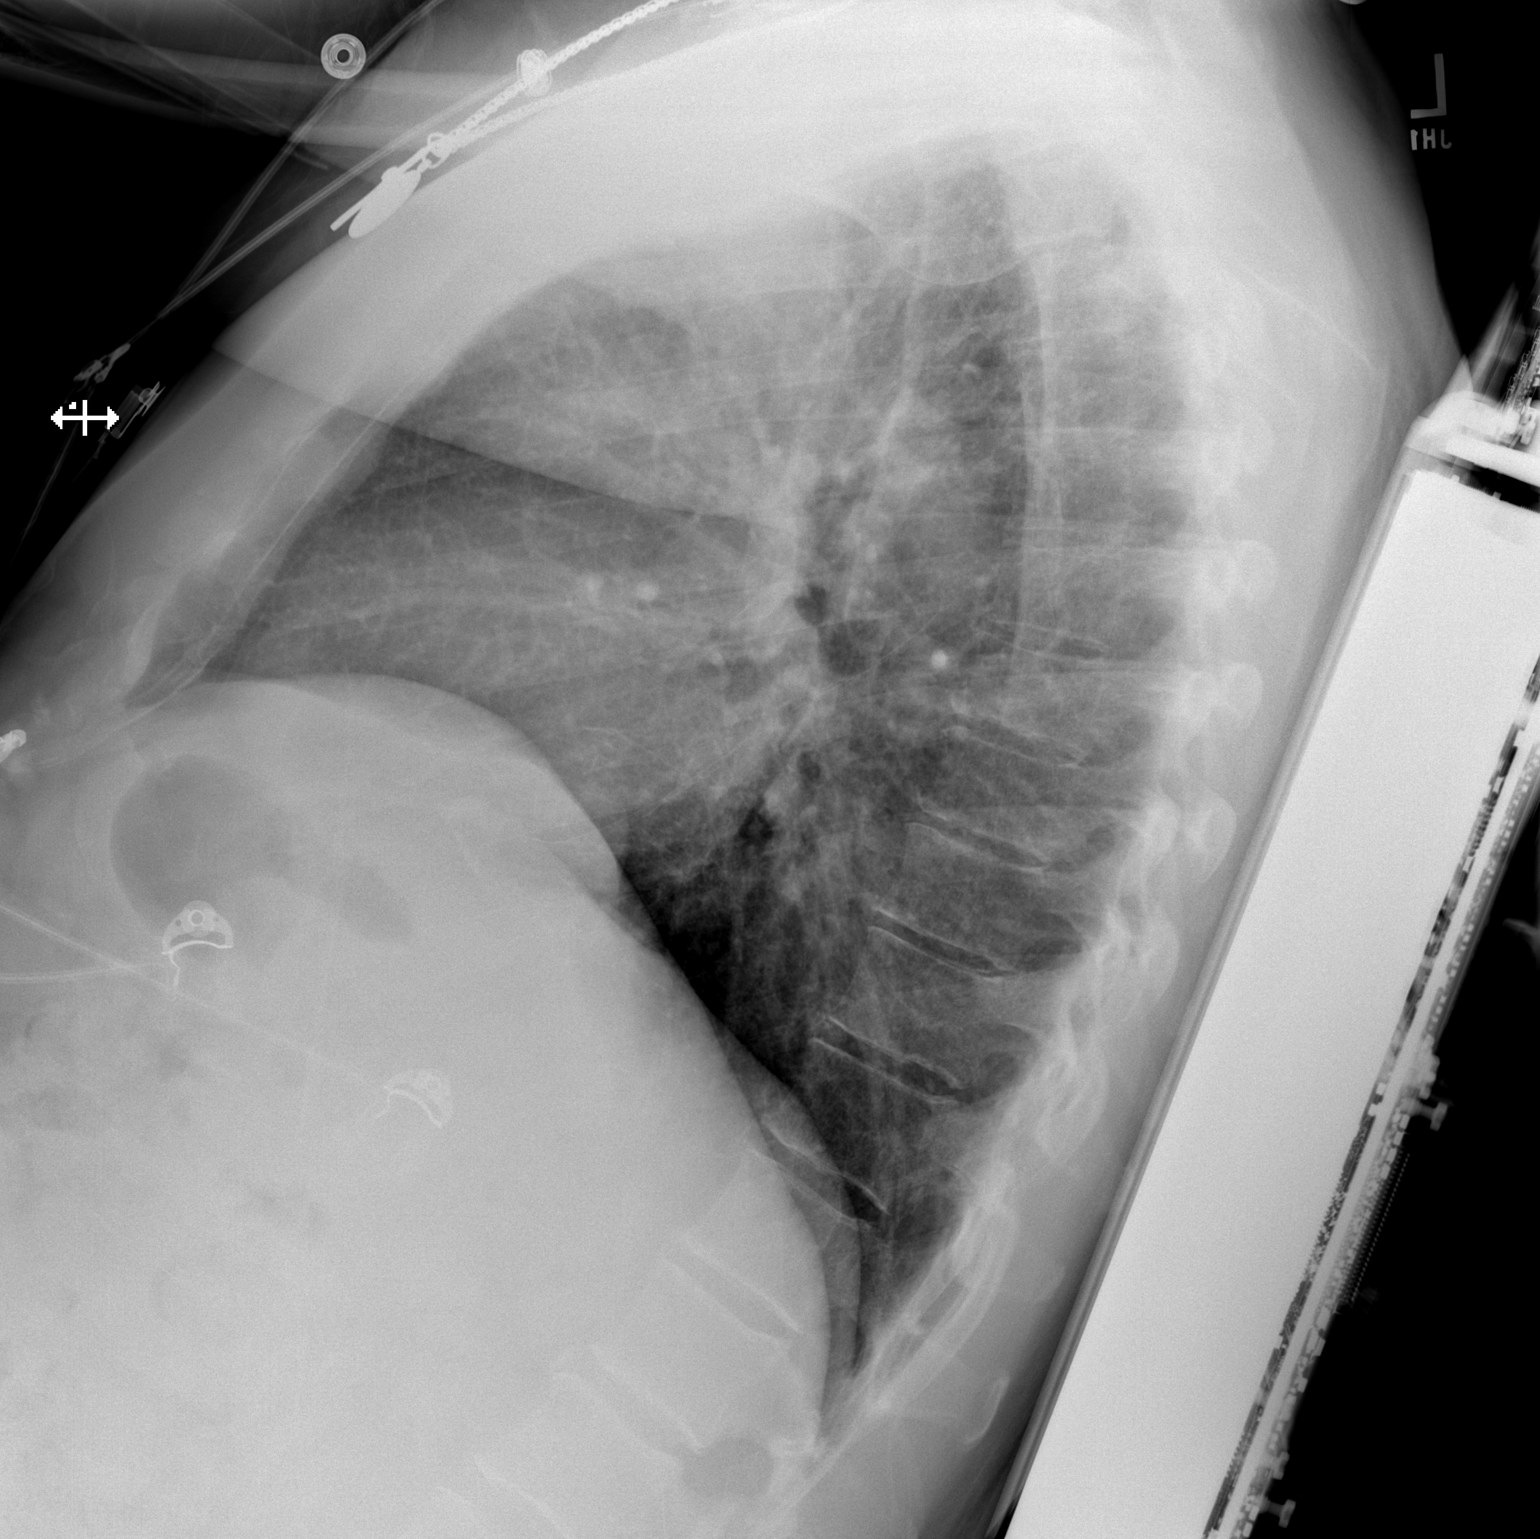

[x chest ap]
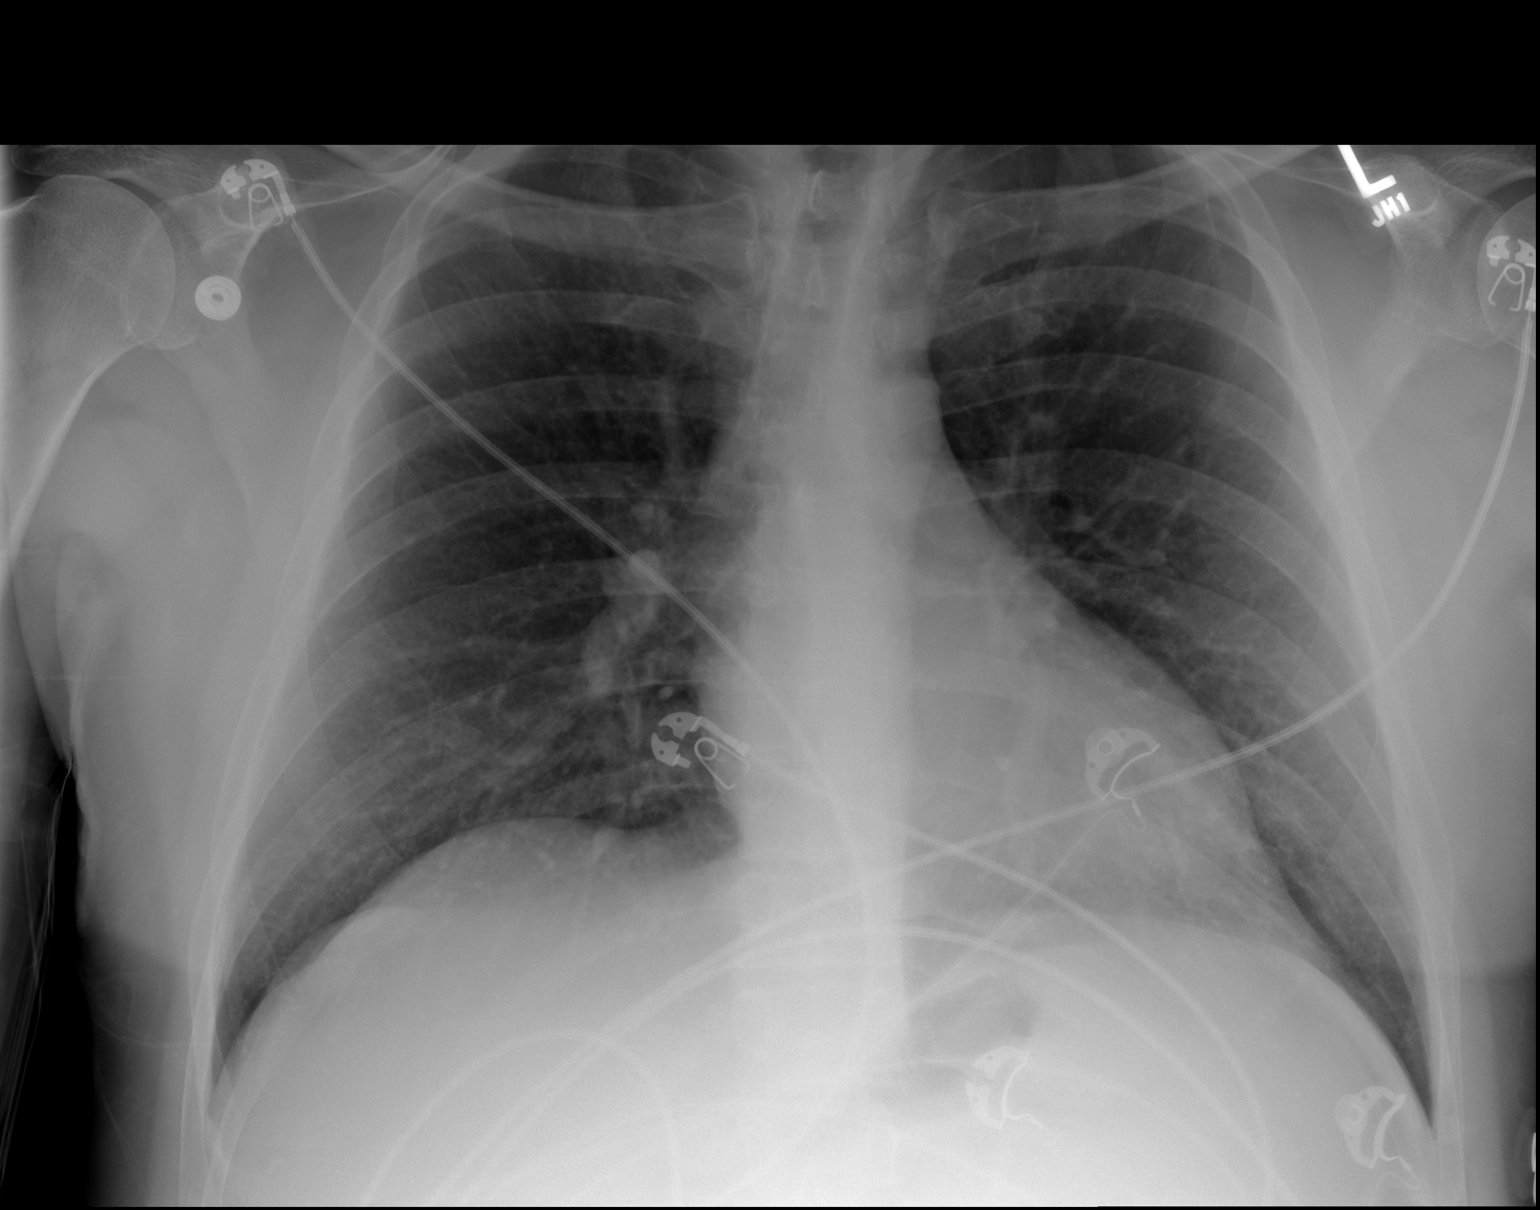

[2 of 2 positions shown; findings below may reference images not displayed]

FINDINGS: The heart size and mediastinal contours are within normal limits.
Both lungs are clear. No pneumothorax or pleural effusion is noted.
The visualized skeletal structures are unremarkable.
IMPRESSION: No active cardiopulmonary disease.

## 2019-06-30 IMAGING — CT CT ANGIO CHEST
2 of 6 series · 18 of 46 positions shown · IV contrast (ISOVUE)
Comparison: Chest x-ray 11/05/2017

CLINICAL DATA: Dizziness, chest tightness with deep breathing

EXAM:
CT ANGIOGRAPHY CHEST WITH CONTRAST
TECHNIQUE: Multidetector CT imaging of the chest was performed using the
standard protocol during bolus administration of intravenous
contrast. Multiplanar CT image reconstructions and MIPs were
obtained to evaluate the vascular anatomy.
CONTRAST:  100mL [id number] IOPAMIDOL ([id number]) INJECTION 76%

[Series 5: thins · axial · 0.73mm/px · z∈[-267,+6]mm · 15 of 299 slices shown]
[im 13/299  lung]
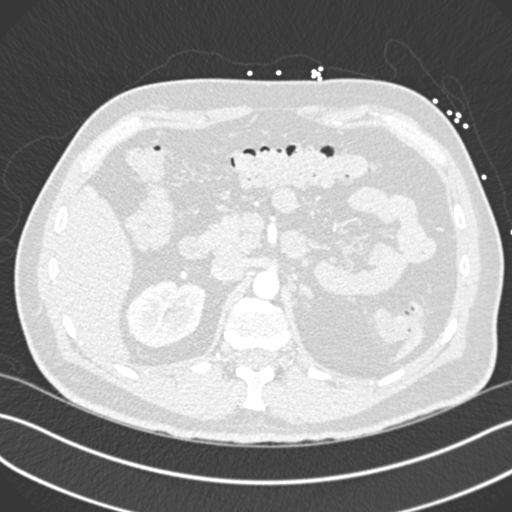
[im 39/299  soft-tissue]
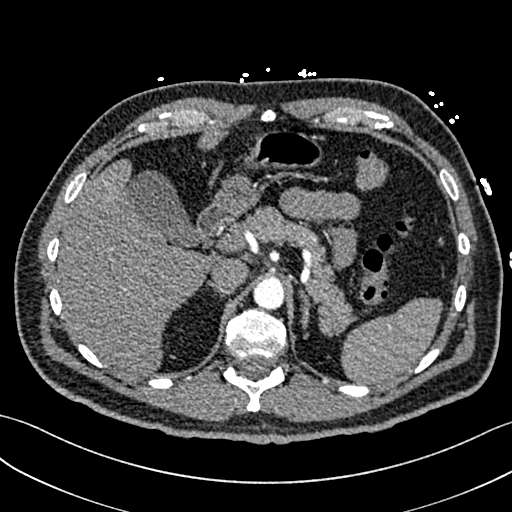
[im 52/299  lung]
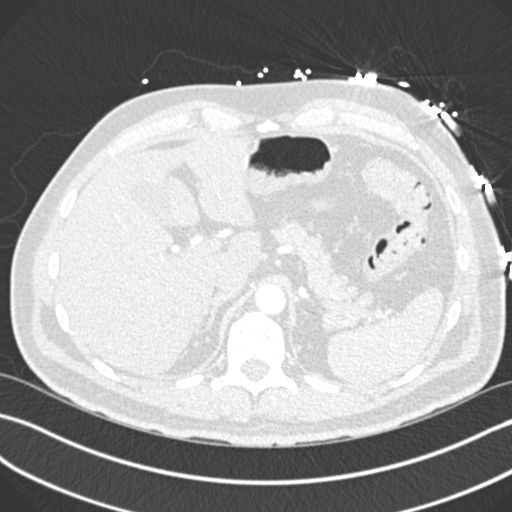
[im 78/299  soft-tissue]
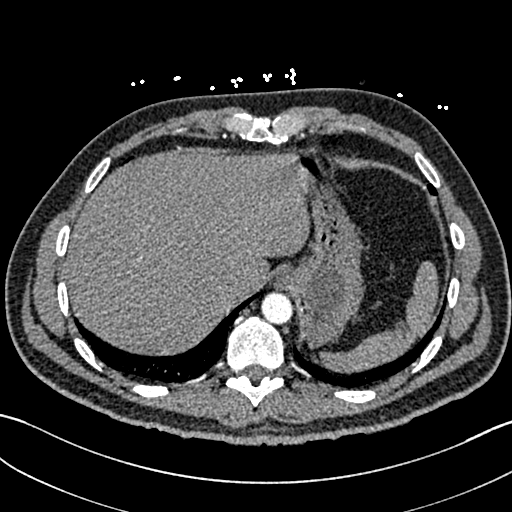
[im 91/299  lung]
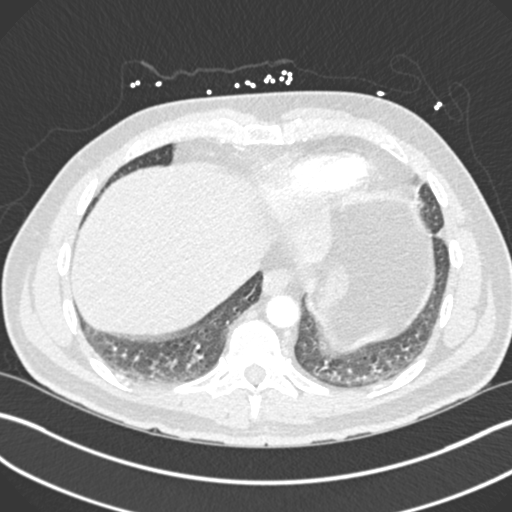
[im 117/299  soft-tissue]
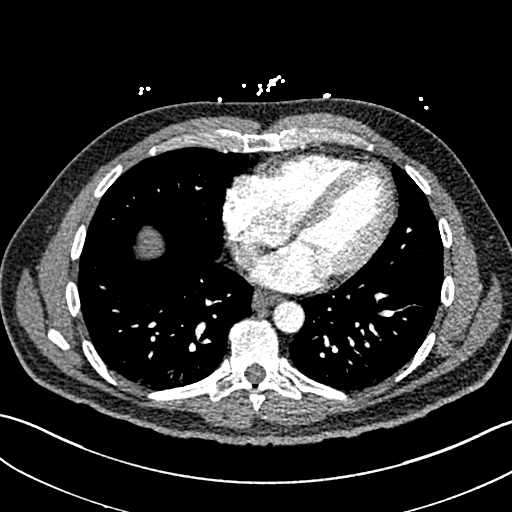
[im 130/299  lung]
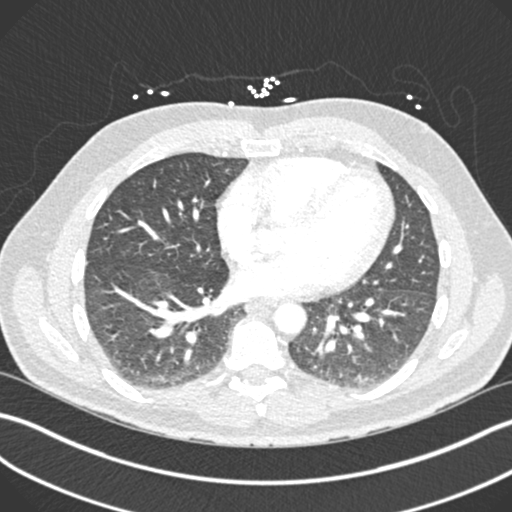
[im 156/299  soft-tissue]
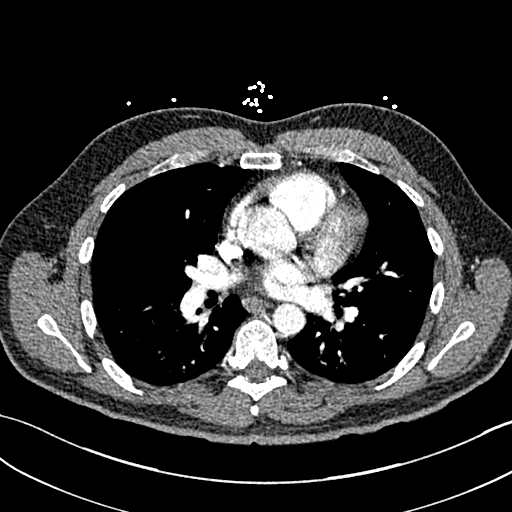
[im 169/299  lung]
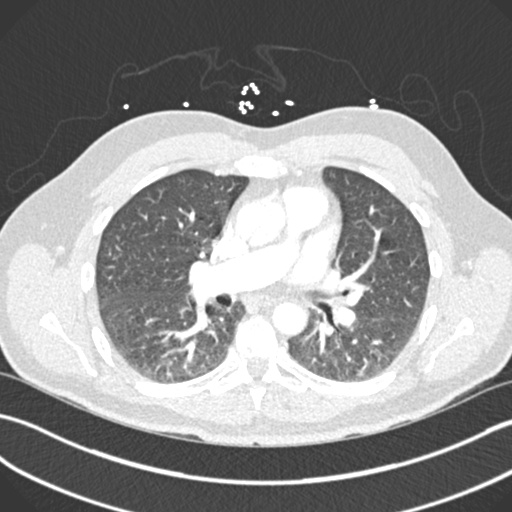
[im 182/299  soft-tissue]
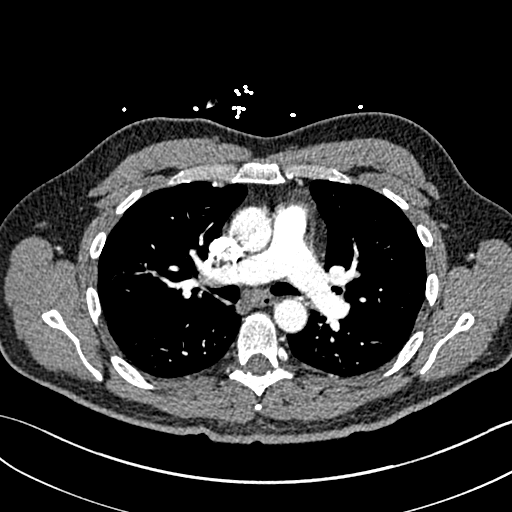
[im 208/299  lung]
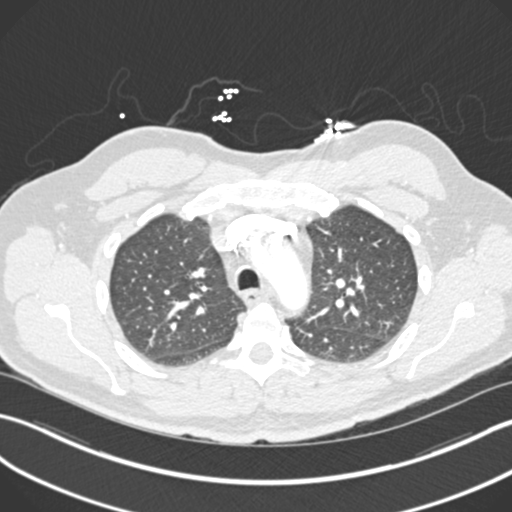
[im 221/299  soft-tissue]
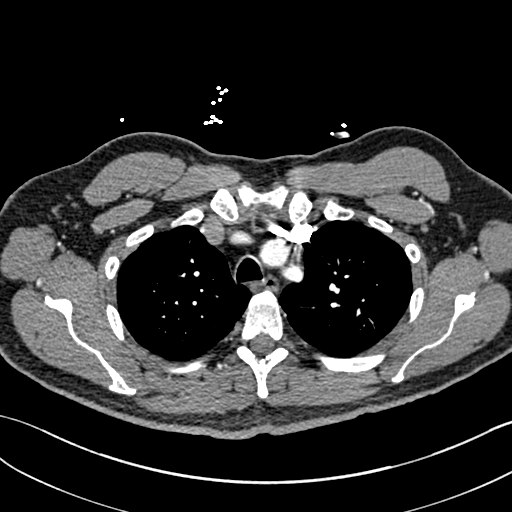
[im 247/299  lung]
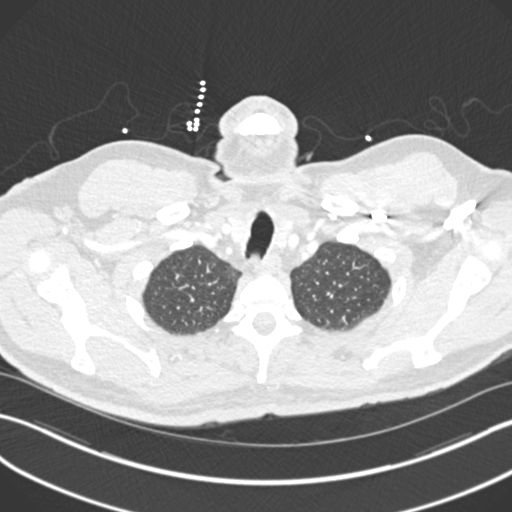
[im 260/299  soft-tissue]
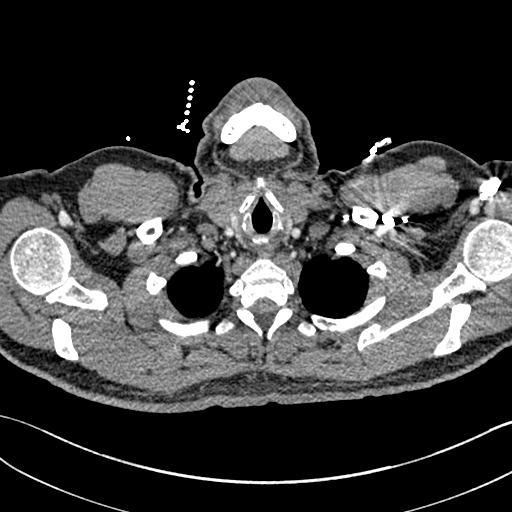
[im 286/299  lung]
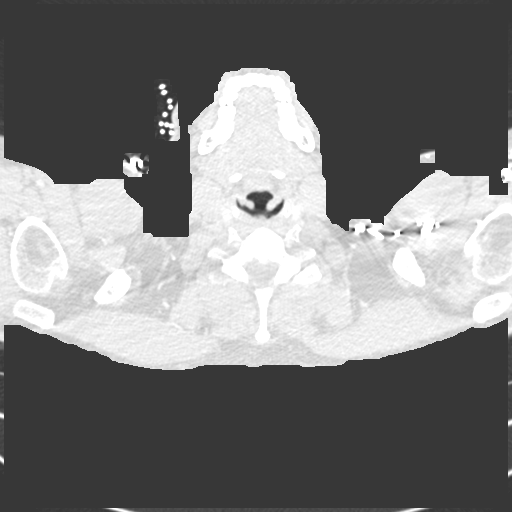

[Series 6: coronal mpr · coronal · 0.62mm/px · 3 of 149 slices shown]
[im 38/149  soft-tissue]
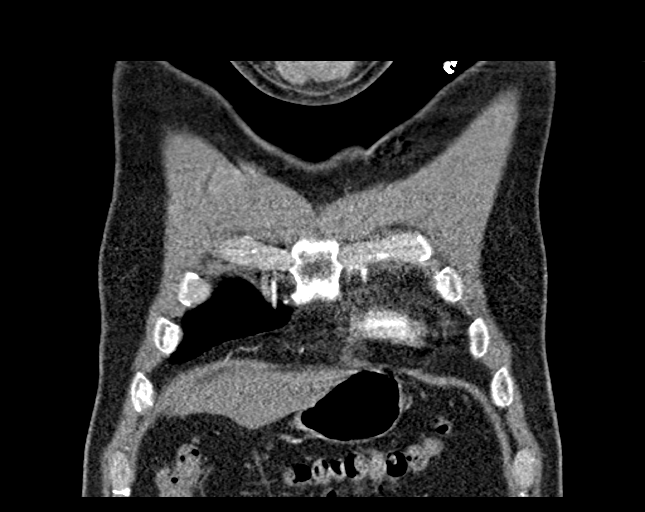
[im 75/149  soft-tissue]
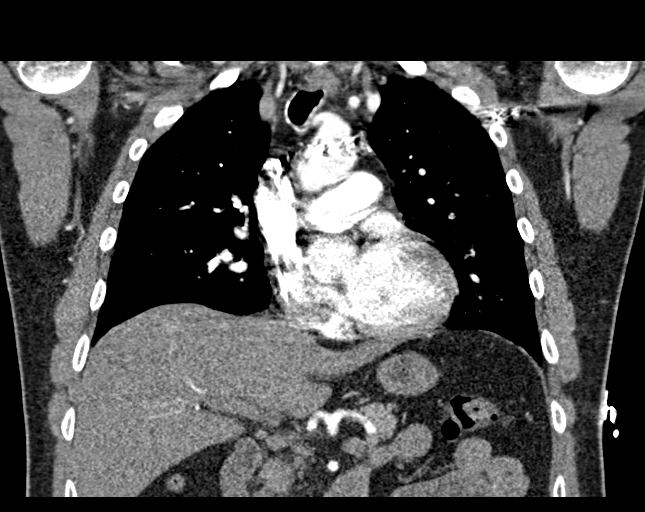
[im 112/149  soft-tissue]
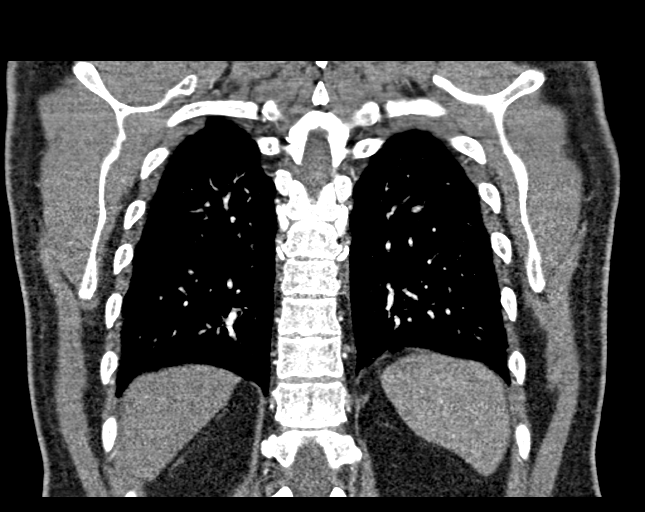

[18 of 46 positions shown; findings below may reference images not displayed]

FINDINGS: Cardiovascular: No filling defects in the pulmonary arteries to
suggest pulmonary emboli. Insert Heart

Mediastinum/Nodes: No mediastinal, hilar, or axillary adenopathy.

Lungs/Pleura: Lungs are clear. No focal airspace opacities or
suspicious nodules. No effusions.

Upper Abdomen: Imaging into the upper abdomen shows no acute
findings.

Musculoskeletal: Chest wall soft tissues are unremarkable. No acute
bony abnormality.

Review of the MIP images confirms the above findings.
IMPRESSION: No evidence of pulmonary embolus.

No acute cardiopulmonary disease.

## 2021-07-27 ENCOUNTER — Other Ambulatory Visit: Payer: Self-pay | Admitting: Sports Medicine

## 2021-07-27 ENCOUNTER — Other Ambulatory Visit: Payer: Self-pay | Admitting: Dentistry

## 2021-07-27 ENCOUNTER — Ambulatory Visit
Admission: RE | Admit: 2021-07-27 | Discharge: 2021-07-27 | Disposition: A | Discharge status: Home / Self Care | Payer: BC Managed Care – PPO | Source: Ambulatory Visit | Attending: Sports Medicine | Admitting: Sports Medicine

## 2021-07-27 ENCOUNTER — Other Ambulatory Visit: Payer: Self-pay

## 2021-07-27 DIAGNOSIS — M542 Cervicalgia: Secondary | ICD-10-CM
